# Patient Record
Sex: Male | Born: 1965 | Hispanic: Yes | Marital: Single | Smoking: Never smoker
Health system: Southern US, Community
[De-identification: ages and names within clinical notes are randomized; demographics above are authoritative.]

## PROBLEM LIST (undated history)

## (undated) HISTORY — PX: KNEE SURGERY: SHX244

---

## 2010-12-22 ENCOUNTER — Emergency Department (HOSPITAL_COMMUNITY): Payer: No Typology Code available for payment source

## 2010-12-22 ENCOUNTER — Emergency Department (HOSPITAL_COMMUNITY)
Admission: EM | Admit: 2010-12-22 | Discharge: 2010-12-23 | Disposition: A | Discharge status: Home / Self Care | Payer: No Typology Code available for payment source | Attending: Emergency Medicine | Admitting: Emergency Medicine

## 2010-12-22 DIAGNOSIS — S82109A Unspecified fracture of upper end of unspecified tibia, initial encounter for closed fracture: Secondary | ICD-10-CM | POA: Insufficient documentation

## 2010-12-22 DIAGNOSIS — S0003XA Contusion of scalp, initial encounter: Secondary | ICD-10-CM | POA: Insufficient documentation

## 2010-12-22 DIAGNOSIS — M25569 Pain in unspecified knee: Secondary | ICD-10-CM | POA: Insufficient documentation

## 2010-12-22 DIAGNOSIS — M542 Cervicalgia: Secondary | ICD-10-CM | POA: Insufficient documentation

## 2010-12-22 DIAGNOSIS — R51 Headache: Secondary | ICD-10-CM | POA: Insufficient documentation

## 2010-12-27 ENCOUNTER — Encounter (HOSPITAL_COMMUNITY)
Admission: RE | Admit: 2010-12-27 | Discharge: 2010-12-27 | Disposition: A | Discharge status: Home / Self Care | Payer: No Typology Code available for payment source | Source: Ambulatory Visit | Attending: Orthopedic Surgery | Admitting: Orthopedic Surgery

## 2010-12-27 DIAGNOSIS — Z01818 Encounter for other preprocedural examination: Secondary | ICD-10-CM | POA: Insufficient documentation

## 2010-12-27 DIAGNOSIS — Z01812 Encounter for preprocedural laboratory examination: Secondary | ICD-10-CM | POA: Insufficient documentation

## 2010-12-27 LAB — BASIC METABOLIC PANEL
BUN: 16 mg/dL (ref 6–23)
GFR calc non Af Amer: >60 mL/min (ref >60)
Glucose, Bld: 98 mg/dL (ref 70–99)
Potassium: 4.2 mEq/L (ref 3.5–5.1)

## 2010-12-27 LAB — CBC
MCH: 31.1 pg (ref 26.0–34.0)
MCHC: 35 g/dL (ref 30.0–36.0)
Platelets: 257 10*3/uL (ref 150–400)
RDW: 12.6 % (ref 11.5–15.5)

## 2010-12-27 LAB — PROTIME-INR: Prothrombin Time: 12.8 seconds (ref 11.6–15.2)

## 2010-12-28 ENCOUNTER — Ambulatory Visit (HOSPITAL_COMMUNITY): Payer: No Typology Code available for payment source

## 2010-12-28 ENCOUNTER — Ambulatory Visit (HOSPITAL_COMMUNITY)
Admission: RE | Admit: 2010-12-28 | Discharge: 2010-12-30 | Disposition: A | Discharge status: Home / Self Care | Payer: No Typology Code available for payment source | Source: Ambulatory Visit | Attending: Orthopedic Surgery | Admitting: Orthopedic Surgery

## 2010-12-28 DIAGNOSIS — X58XXXA Exposure to other specified factors, initial encounter: Secondary | ICD-10-CM | POA: Insufficient documentation

## 2010-12-28 DIAGNOSIS — S022XXA Fracture of nasal bones, initial encounter for closed fracture: Secondary | ICD-10-CM | POA: Insufficient documentation

## 2010-12-28 DIAGNOSIS — S82109A Unspecified fracture of upper end of unspecified tibia, initial encounter for closed fracture: Secondary | ICD-10-CM | POA: Insufficient documentation

## 2010-12-29 LAB — PROTIME-INR: INR: 1.07 (ref 0.00–1.49)
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
## 2012-08-23 IMAGING — CT CT KNEE*L* W/O CM
3 of 4 series · 15 of 33 positions shown, 19 images · non-contrast
Comparison: Plain films earlier today

CLINICAL DATA: knee fracture, pain

CT OF THE LEFT KNEE WITHOUT CONTRAST
TECHNIQUE: Multidetector CT imaging was performed according to the
standard protocol. Multiplanar CT image reconstructions were also
generated.

[Series 5: extremityknee 2.0 b40s · axial · 0.35mm/px · z∈[+403,+591]mm · 9 of 112 slices shown, 12 images]
[im 9/112  soft-tissue]
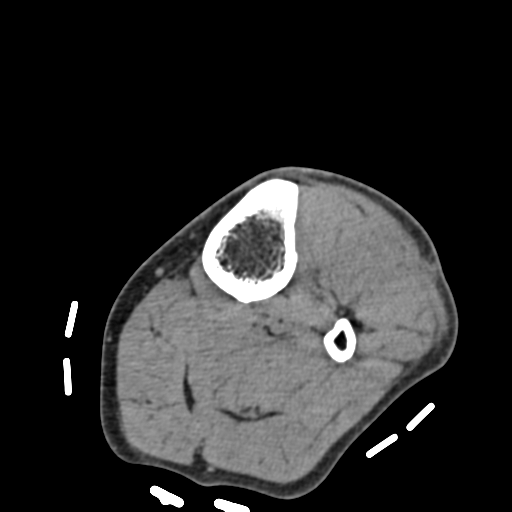
[im 9/112  bone]
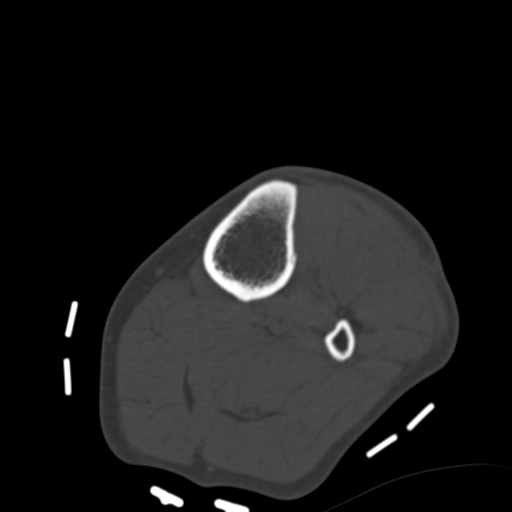
[im 26/112  bone]
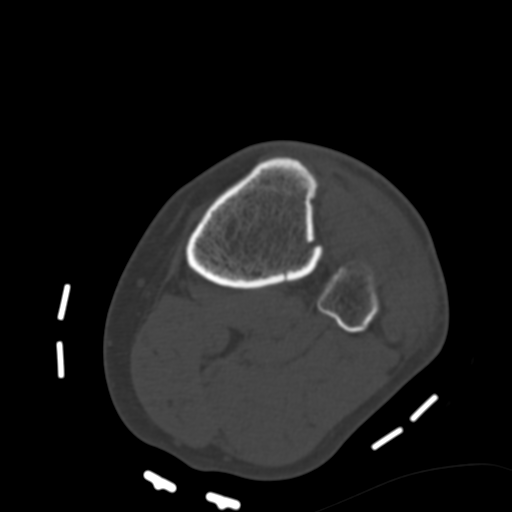
[im 35/112  bone]
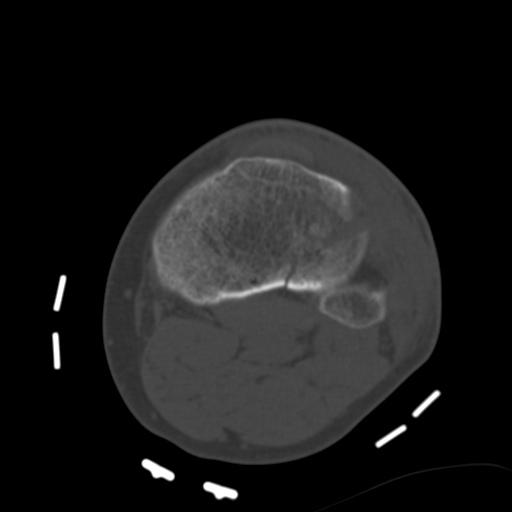
[im 43/112  bone]
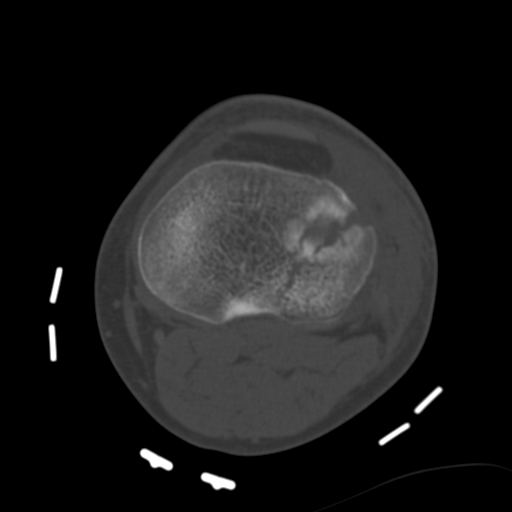
[im 60/112  soft-tissue]
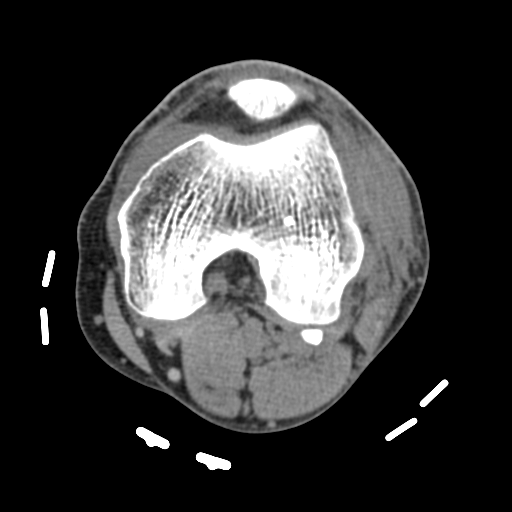
[im 60/112  bone]
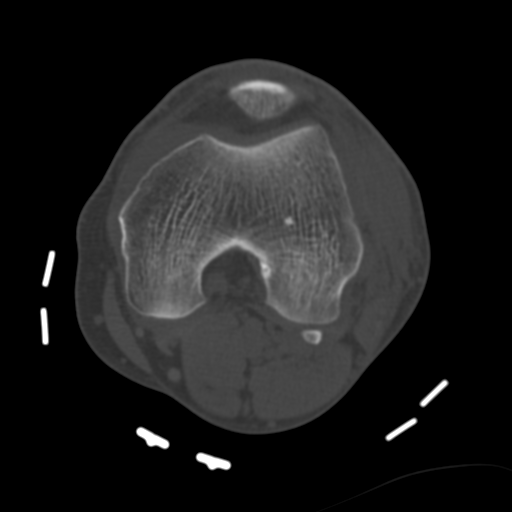
[im 69/112  bone]
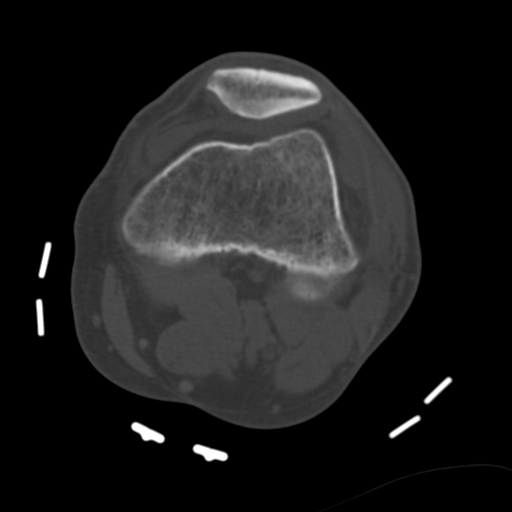
[im 77/112  bone]
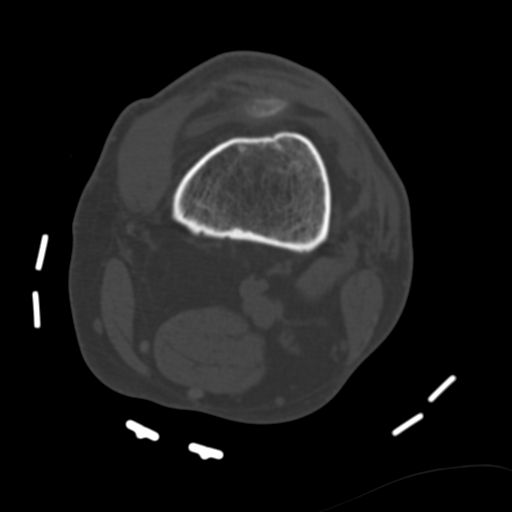
[im 94/112  bone]
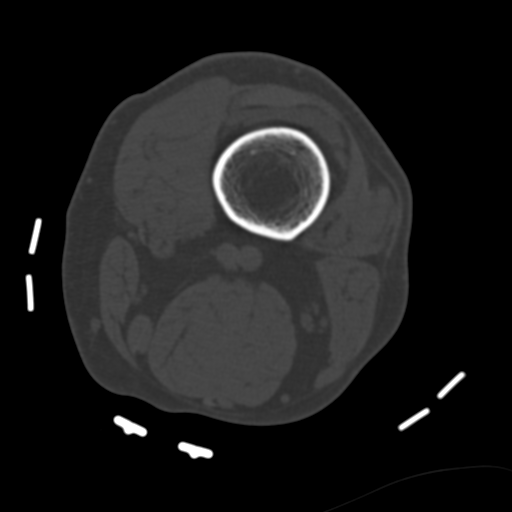
[im 103/112  soft-tissue]
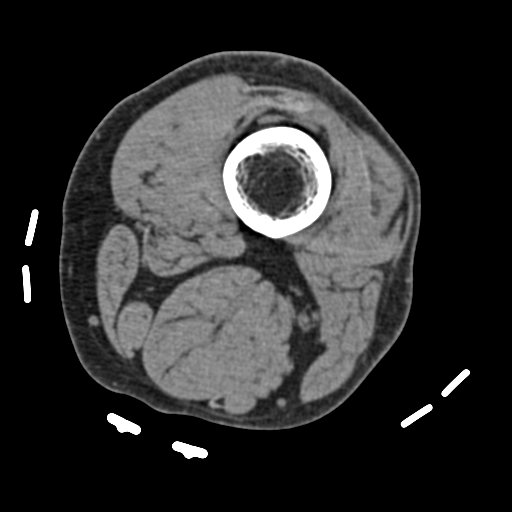
[im 103/112  bone]
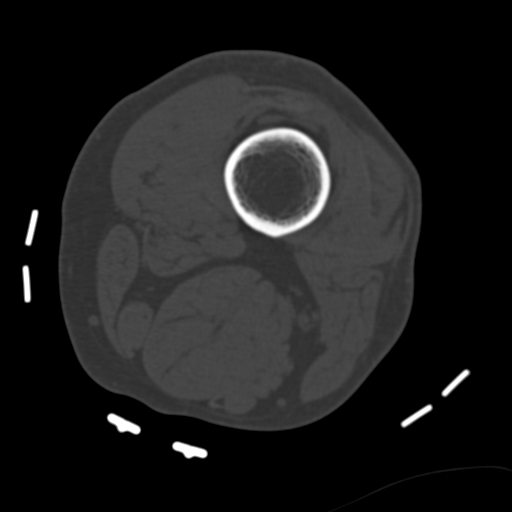

[Series 602: cor st · coronal · 0.44mm/px · 1 of 81 slices shown]
[im 41/81  bone]
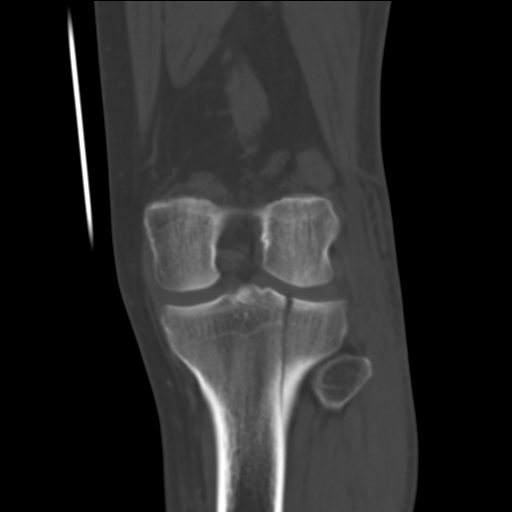

[Series 603: sag knee st left · sagittal · 0.44mm/px · 5 of 65 slices shown, 6 images]
[im 22/65  bone]
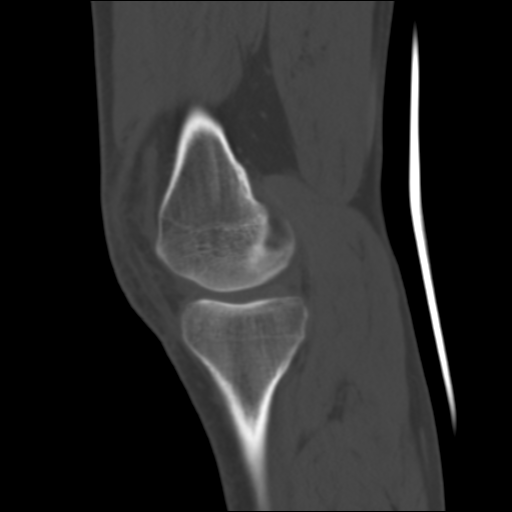
[im 27/65  bone]
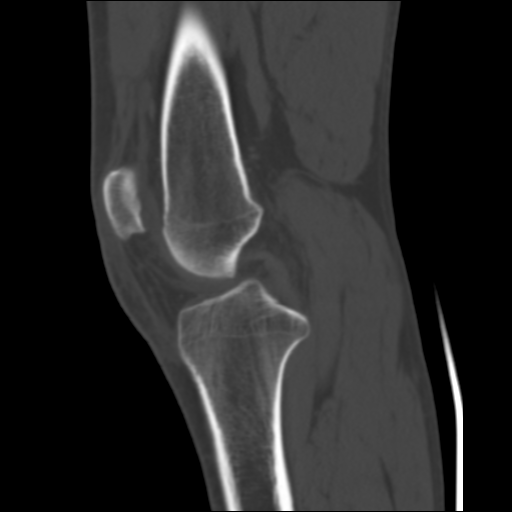
[im 33/65  soft-tissue]
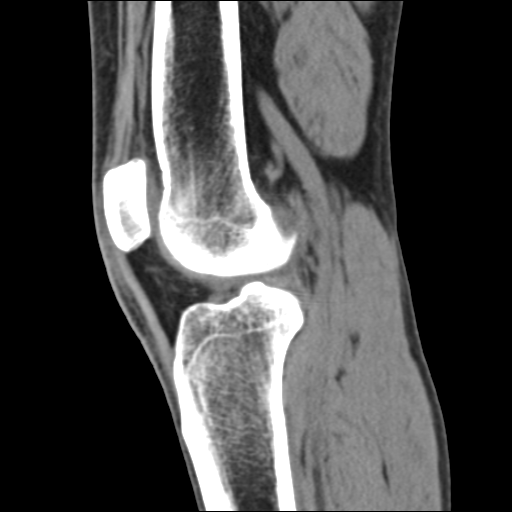
[im 33/65  bone]
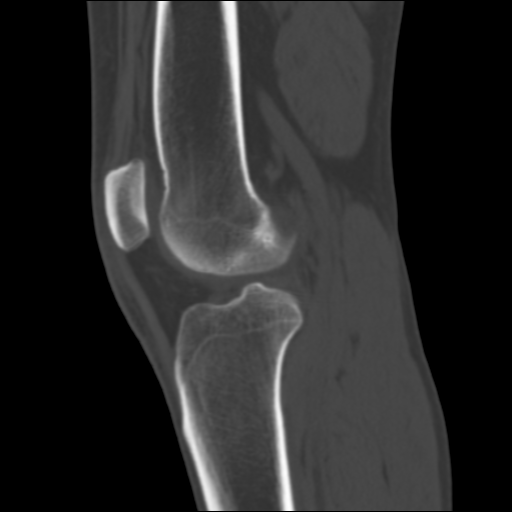
[im 38/65  bone]
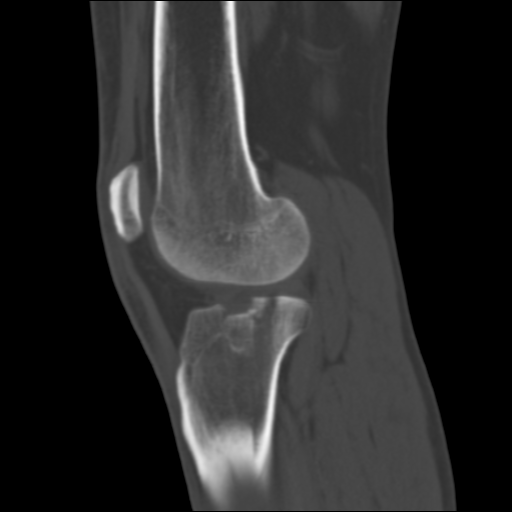
[im 43/65  bone]
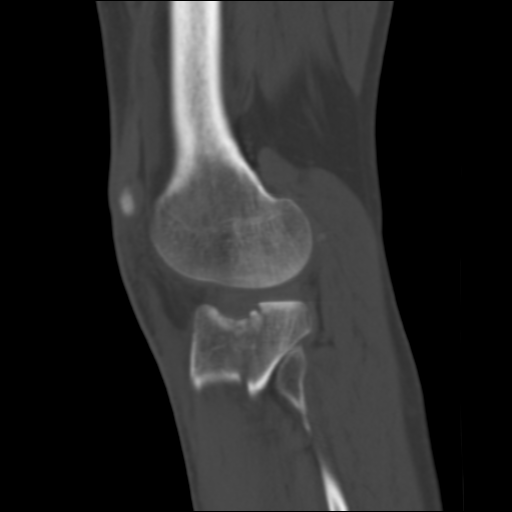

[15 of 33 positions shown; findings below may reference images not displayed]

FINDINGS: Lateral tibial plateau fracture is demonstrated with a
prominent sagittal component which exits inferiorly.  Centrally,
there is approximately 3-4 mm of depression of the lateral tibial
plateau.  This is identified best on coronal images 35 - 39.
Depression is also confirmed on sagittal reformatted images 38-45.
Medial tibial plateau is intact.  No femur injury.  Small joint
effusion.
IMPRESSION: Mildly depressed lateral tibial plateau fracture as described.
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
## 2018-07-08 ENCOUNTER — Encounter: Payer: Self-pay | Admitting: Physician Assistant

## 2018-07-08 ENCOUNTER — Encounter: Payer: Self-pay | Admitting: Gastroenterology

## 2018-07-08 ENCOUNTER — Other Ambulatory Visit: Payer: Self-pay

## 2018-07-08 ENCOUNTER — Ambulatory Visit: Payer: Self-pay | Admitting: Physician Assistant

## 2018-07-08 VITALS — BP 136/81 | HR 74 | Temp 98.1°F | Resp 18 | Ht 67.0 in | Wt 192.2 lb

## 2018-07-08 DIAGNOSIS — M25571 Pain in right ankle and joints of right foot: Secondary | ICD-10-CM

## 2018-07-08 DIAGNOSIS — S96911A Strain of unspecified muscle and tendon at ankle and foot level, right foot, initial encounter: Secondary | ICD-10-CM

## 2018-07-08 DIAGNOSIS — R1111 Vomiting without nausea: Secondary | ICD-10-CM

## 2018-07-08 DIAGNOSIS — K219 Gastro-esophageal reflux disease without esophagitis: Secondary | ICD-10-CM

## 2018-07-08 MED ORDER — MELOXICAM 7.5 MG PO TABS: 7.5000 mg | ORAL_TABLET | Freq: Every day | ORAL | 0 refills | Status: AC

## 2018-07-08 MED ORDER — PANTOPRAZOLE SODIUM 40 MG PO TBEC
DELAYED_RELEASE_TABLET | ORAL | 1 refills | Status: DC
Start: 2018-07-08 — End: 2018-07-23

## 2018-07-08 NOTE — Progress Notes (Signed)
Jesse Martinez  MRN: [id number] DOB: Jan 03, 1966  PCP: Patient, No Pcp Per  Subjective:  Pt is a 52 year old male who presents to clinic for several complaints.   1) Right ankle pain x 6 weeks.  He was walking at work when he first noticed the pain on the outside of his right ankle below the ankle bone.  He wears work boots with orthotic support.  He has never had this problem before.  He is not taking anything for pain.  He recently started using an Ace wrap but this is not helping.  2) vomiting x 2-3 years. "Anytime I drink something it comes back up" No problems with food coming back up. However he admits to food sometimes feeling stuck and occasional vomiting after dinner.  nothing makes it better. No hematemesis.  Denies abdominal pain, burning sensation of his chest, sour taste in his throat, nausea, fever, chills. He has never been seen for this problem before.    Review of Systems  Gastrointestinal: Positive for vomiting. Negative for abdominal pain, diarrhea and nausea.  Musculoskeletal: Positive for arthralgias (Right ankle) and gait problem. Negative for joint swelling.  Skin: Negative.   Neurological: Negative for weakness and numbness.    There are no active problems to display for this patient.   No current outpatient medications on file prior to visit.   No current facility-administered medications on file prior to visit.     No Known Allergies   Objective:  BP 136/81   Pulse 74   Temp 98.1 F (36.7 C) (Oral)   Resp 18   Ht 5\' 7"  (1.702 m)   Wt 192 lb 3.2 oz (87.2 kg)   SpO2 94%   BMI 30.10 kg/m   Physical Exam  Constitutional: He is oriented to person, place, and time. He appears well-developed and well-nourished.  Abdominal: Soft. Normal appearance. There is no tenderness.  Musculoskeletal:       Right ankle: He exhibits normal range of motion, no swelling and no ecchymosis. Tenderness. AITFL tenderness found.  Neurological: He is alert and  oriented to person, place, and time.  Skin: Skin is warm and dry.  Psychiatric: He has a normal mood and affect. His behavior is normal. Judgment and thought content normal.  Vitals reviewed.   Assessment and Plan :  1. Vomiting without nausea, intractability of vomiting not specified, unspecified vomiting type 2. Gastroesophageal reflux disease, esophagitis presence not specified -Patient presents with regurgitation of liquids for the past several years.  Will treat supportively with Protonix at this time, however I feel he would benefit from evaluation from GI with endoscopy.  He understands and agrees with plan. - Ambulatory referral to Gastroenterology - pantoprazole (PROTONIX) 40 MG tablet; Take one tablet 30 min before the first meal of the day for 4-6 weeks.  Dispense: 30 tablet; Refill: 1  3. Strain of right ankle, initial encounter 4. Pain in joint involving right ankle and foot -Patient presents with right ankle pain for the past 6 weeks.  Suspect a ATFL strain.  Ace wrap applied.  Discussed price principles.  Plan to refer to physical therapy as he is several weeks out from injury and pain is not improving. - Ambulatory referral to Physical Therapy - meloxicam (MOBIC) 7.5 MG tablet; Take 1-2 tablets (7.5-15 mg total) by mouth daily.  Dispense: 60 tablet; Refill: 0   Mercer Pod, PA-C  [facility] [place] 07/08/2018 2:19 PM  Please note:  Portions of this report may have been transcribed using dragon voice recognition software. Every effort was made to ensure accuracy; however, inadvertent computerized transcription errors may be present.

## 2018-07-08 NOTE — Patient Instructions (Addendum)
You will receive a phone call to schedule an appointment with gastroenterology -- you will have an evaluation of your esophagus and stomach to figure out why you are throwing up.  Start taking Pantoprazole 40mg  30 min before your first meal of the day x 4-6 weeks.    For your ankle:   You will receive a phone call to schedule an appointment with physical therapy for your ankle.   Take Meloxicam 7.5-15mg  daily. This is an NSAID. Do not use with any other otc pain medication other than tylenol/acetaminophen - so no aleve, ibuprofen, motrin, advil, etc.  Initial management goals are to limit pain and swelling and to maintain range of motion before gradually increasing exercise. RICE (rest, ice, compression, elevation) is commonly recommended for the first two to three days:  ?Rest is achieved by limiting weightbearing; use crutches until you are able to walk with a normal gait. ?Apply ice for 15 to 20 minutes every two to three hours for the first 48 hours or until swelling is improved, whichever comes first. ?Compression with an elastic bandage to minimize swelling should be applied early. ?The injured ankle should be kept elevated above the level of the heart to further alleviate swelling.  Nonsteroidal antiinflammatory drugs (NSAIDs) can be used for pain relief. So naproxen, meloxicam, ibuprofen, motrin or aleve.   Exercise is the mainstay of recovery. Range of motion exercises, including plantar flexion, dorsiflexion, and foot circles, should be started early, once acute pain and swelling subside, to maintain range of motion. The intensity of rehabilitation is increased gradually. Ankle splints or braces can limit extremes of joint motion and allow early weightbearing while protecting against reinjury.  Ankle injuries may take up to 8 weeks to heal.       IF you received an x-ray today, you will receive an invoice from [facility]. Please contact [facility] at  [phone] with questions or concerns regarding your invoice.   IF you received labwork today, you will receive an invoice from [facility]. Please contact [facility] at [phone] with questions or concerns regarding your invoice.   Our billing staff will not be able to assist you with questions regarding bills from these companies.  You will be contacted with the lab results as soon as they are available. The fastest way to get your results is to activate your My Chart account. Instructions are located on the last page of this paperwork. If you have not heard from [place] regarding the results in 2 weeks, please contact this office.

## 2018-07-23 ENCOUNTER — Ambulatory Visit (INDEPENDENT_AMBULATORY_CARE_PROVIDER_SITE_OTHER): Payer: Self-pay | Admitting: Gastroenterology

## 2018-07-23 ENCOUNTER — Encounter: Payer: Self-pay | Admitting: Gastroenterology

## 2018-07-23 VITALS — BP 116/88 | HR 69 | Ht 67.0 in | Wt 193.4 lb

## 2018-07-23 DIAGNOSIS — Z1211 Encounter for screening for malignant neoplasm of colon: Secondary | ICD-10-CM

## 2018-07-23 DIAGNOSIS — R1310 Dysphagia, unspecified: Secondary | ICD-10-CM

## 2018-07-23 DIAGNOSIS — Z1212 Encounter for screening for malignant neoplasm of rectum: Secondary | ICD-10-CM

## 2018-07-23 DIAGNOSIS — R1110 Vomiting, unspecified: Secondary | ICD-10-CM

## 2018-07-23 DIAGNOSIS — R1013 Epigastric pain: Secondary | ICD-10-CM

## 2018-07-23 MED ORDER — PANTOPRAZOLE SODIUM 40 MG PO TBEC: 40.0000 mg | DELAYED_RELEASE_TABLET | Freq: Every day | ORAL | 3 refills | Status: AC

## 2018-07-23 MED ORDER — SOD PICOSULFATE-MAG OX-CIT ACD 10-3.5-12 MG-GM -GM/160ML PO SOLN: 1.0000 | ORAL | 0 refills | Status: DC

## 2018-07-23 NOTE — Patient Instructions (Signed)
If you are age 51 or older, your body mass index should be between 23-30. Your Body mass index is 30.29 kg/m. If this is out of the aforementioned range listed, please consider follow up with your Primary Care Provider.  If you are age 7 or younger, your body mass index should be between 19-25. Your Body mass index is 30.29 kg/m. If this is out of the aformentioned range listed, please consider follow up with your Primary Care Provider.   You have been scheduled for an endoscopy and colonoscopy. Please follow the written instructions given to you at your visit today. Please pick up your prep supplies at the pharmacy within the next 1-3 days. If you use inhalers (even only as needed), please bring them with you on the day of your procedure. Your physician has requested that you go to www.startemmi.com and enter the access code given to you at your visit today. This web site gives a general overview about your procedure. However, you should still follow specific instructions given to you by our office regarding your preparation for the procedure.  We have sent the following medications to your pharmacy for you to pick up at your convenience: Protonix 40 mg by mouth once daily.  It was a pleasure to see you today!  Vito Cirigliano, D.O.

## 2018-07-23 NOTE — Progress Notes (Signed)
Chief Complaint: Regurgitation   Referring Provider:     Dorise Hiss    HPI:     Jesse Martinez is a 52 y.o. male referred to the Gastroenterology Clinic for evaluation of regurgitation.   Sxs worse in the morning, with regurgitation of recently ingested fluids. Has been ongoing for years, but worsening over recent months. No HB, waterbrash, chronic cough. No nocturnal sxs. Does not occur with solids, but has had separate episodes of dysphagia to solids. This occurs intermittently for a few years. No hx of food impactions. No overt GI blood loss.  He was evaluated for this by his PCM on 07/08/18 and prescribed course of Protonix 40 mg daily and referred to the GI clinic for further evaluation. He has not started the pantoprazole- wanted to wait for this appt first. Has not trialed any OTC meds.  He does endorse intermittent MEG pain, which is unrelated to timing of above regurgitation. No radiation and not related to PO intake. No exacerbating or alleviating factors. Patient otherwise denies diarrhea, constipation, hematochezia, melena, night sweats, fever, chills, weight loss, early satiety, dysphagia, odynophagia.  No prior EGD or colonoscopy. No prior CRC screening. FHx w/o GI malignancy, hepatobiliary disease, IBD.   History reviewed. No pertinent past medical history.   Past Surgical History:  Procedure Laterality Date  . KNEE REPAIR EXTENSOR MECHANISM Left    had a screw put in 7 years    Family History  Problem Relation Age of Onset  . Diabetes Father   . Breast cancer Sister   . Colon cancer Neg Hx   . Esophageal cancer Neg Hx    Social History   Tobacco Use  . Smoking status: Never Smoker  . Smokeless tobacco: Never Used  Substance Use Topics  . Alcohol use: Yes    Comment: 3 beers a day   . Drug use: Never   Current Outpatient Medications  Medication Sig Dispense Refill  . meloxicam (MOBIC) 7.5 MG tablet Take 1-2 tablets  (7.5-15 mg total) by mouth daily. (Patient not taking: Reported on 07/23/2018) 60 tablet 0  . pantoprazole (PROTONIX) 40 MG tablet Take one tablet 30 min before the first meal of the day for 4-6 weeks. (Patient not taking: Reported on 07/23/2018) 30 tablet 1   No current facility-administered medications for this visit.    No Known Allergies   Review of Systems: All systems reviewed and negative except where noted in HPI.     Physical Exam:    Wt Readings from Last 3 Encounters:  07/23/18 193 lb 6 oz (87.7 kg)  07/08/18 192 lb 3.2 oz (87.2 kg)    BP 116/88   Pulse 69   Ht 5' 7"  (1.702 m)   Wt 193 lb 6 oz (87.7 kg)   BMI 30.29 kg/m  Constitutional:  Pleasant, in no acute distress. Psychiatric: Normal mood and affect. Behavior is normal. EENT: Pupils normal.  Conjunctivae are normal. No scleral icterus. Neck supple. No cervical LAD. Cardiovascular: Normal rate, regular rhythm. No edema Pulmonary/chest: Effort normal and breath sounds normal. No wheezing, rales or rhonchi. Abdominal: Soft, nondistended, nontender. Bowel sounds active throughout. There are no masses palpable. No hepatomegaly. Neurological: Alert and oriented to person place and time. Skin: Skin is warm and dry. No rashes noted.   ASSESSMENT AND PLAN;   1) Regurgitation: Clinical presentation seems more c/w reflux given overall lack of nausea and not c/w  Rumination Syndrome. Will eval and tx as below: - Start pantoprazole 40 mg daily for diagnostic and therapeutic effect. Will titrate to lowest effective dose - EGD to eval for LES laxity, hiatal hernia, erosive esophagitis  2) Dysphagia: - Intermittent solid food dysphagia - EGD with possible dilation - Continue to cut food into small pieces, chew thoroughly, and eat with plenty of fluids  3) MEG pain: - Will eval for PUD, gastritis, etc along with plan for random and directed gastric bxs at time of EGD as above to r/o H pylori.   4) CRC screening: No  previous CRC screneing. No family history of CRC or related malignancies, and patient is without any active GI sxs. Discussed options for CRC screening, to include optical vs virtual colonoscopy - the risks and benefits and pros and cons of each, as well as discussion of FIT kit testing, Cologuard, etc, and the patient decided to proceed with an optical colonoscopy.   The indications, risks, and benefits of EGD and colonoscopy were explained to the patient in detail. Risks include but are not limited to bleeding, perforation, adverse reaction to medications, and cardiopulmonary compromise. Sequelae include but are not limited to the possibility of surgery, hositalization, and mortality. The patient verbalized understanding and wished to proceed. All questions answered, referred to scheduler and bowel prep ordered. Further recommendations pending results of the exam.     Lavena Bullion, DO, FACG  07/23/2018, 2:08 PM   [place], Letta Median*

## 2018-08-05 ENCOUNTER — Encounter: Payer: Self-pay | Admitting: Gastroenterology

## 2018-08-05 ENCOUNTER — Ambulatory Visit (AMBULATORY_SURGERY_CENTER): Payer: Self-pay | Admitting: Gastroenterology

## 2018-08-05 VITALS — BP 137/86 | HR 61 | Temp 97.3°F | Resp 15 | Ht 67.0 in | Wt 193.0 lb

## 2018-08-05 DIAGNOSIS — K219 Gastro-esophageal reflux disease without esophagitis: Secondary | ICD-10-CM

## 2018-08-05 DIAGNOSIS — D126 Benign neoplasm of colon, unspecified: Secondary | ICD-10-CM

## 2018-08-05 DIAGNOSIS — K2970 Gastritis, unspecified, without bleeding: Secondary | ICD-10-CM

## 2018-08-05 DIAGNOSIS — D124 Benign neoplasm of descending colon: Secondary | ICD-10-CM

## 2018-08-05 DIAGNOSIS — K635 Polyp of colon: Secondary | ICD-10-CM

## 2018-08-05 DIAGNOSIS — Z1211 Encounter for screening for malignant neoplasm of colon: Secondary | ICD-10-CM

## 2018-08-05 DIAGNOSIS — D125 Benign neoplasm of sigmoid colon: Secondary | ICD-10-CM

## 2018-08-05 MED ORDER — SODIUM CHLORIDE 0.9 % IV SOLN
500.0000 mL | Freq: Once | INTRAVENOUS | Status: DC
Start: 2018-08-05 — End: 2018-08-05

## 2018-08-05 NOTE — Progress Notes (Signed)
Report to PACU, RN, vss, BBS= Clear.  

## 2018-08-05 NOTE — Progress Notes (Signed)
Pt's states no medical or surgical changes since previsit or office visit. 

## 2018-08-05 NOTE — Op Note (Signed)
[place] Patient Name: Jesse Martinez Procedure Date: 08/05/2018 2:06 PM MRN: [id number] Endoscopist: Gerrit Heck , MD Age: 52 Referring MD:  Date of Birth: February 03, 1966 Gender: Male Account #: [id number] Procedure:                Colonoscopy Indications:              Screening for colorectal malignant neoplasm, This                            is the patient's first colonoscopy Medicines:                Monitored Anesthesia Care Procedure:                Pre-Anesthesia Assessment:                           - Prior to the procedure, a History and Physical                            was performed, and patient medications and                            allergies were reviewed. The patient's tolerance of                            previous anesthesia was also reviewed. The risks                            and benefits of the procedure and the sedation                            options and risks were discussed with the patient.                            All questions were answered, and informed consent                            was obtained. Prior Anticoagulants: The patient has                            taken no previous anticoagulant or antiplatelet                            agents. ASA Grade Assessment: II - A patient with                            mild systemic disease. After reviewing the risks                            and benefits, the patient was deemed in                            satisfactory condition to undergo the procedure.  After obtaining informed consent, the colonoscope                            was passed under direct vision. Throughout the                            procedure, the patient's blood pressure, pulse, and                            oxygen saturations were monitored continuously. The                            Colonoscope was introduced through the anus and                            advanced to the the  terminal ileum. The colonoscopy                            was performed without difficulty. The patient                            tolerated the procedure well. The quality of the                            bowel preparation was adequate. Scope In: 2:25:30 PM Scope Out: 2:39:19 PM Scope Withdrawal Time: 0 hours 11 minutes 21 seconds  Total Procedure Duration: 0 hours 13 minutes 49 seconds  Findings:                 The perianal and digital rectal examinations were                            normal.                           A 4 mm polyp was found in the descending colon. The                            polyp was sessile. The polyp was removed with a                            cold snare. Resection and retrieval were complete.                            Estimated blood loss was minimal.                           A 4 mm polyp was found in the sigmoid colon. The                            polyp was sessile. The polyp was removed with a                            cold snare. Resection and retrieval  were complete.                            Estimated blood loss was minimal.                           The retroflexed view of the distal rectum and anal                            verge was normal and showed no anal or rectal                            abnormalities.                           The terminal ileum appeared normal. Complications:            No immediate complications. Estimated Blood Loss:     Estimated blood loss was minimal. Impression:               - One 4 mm polyp in the descending colon, removed                            with a cold snare. Resected and retrieved.                           - One 4 mm polyp in the sigmoid colon, removed with                            a cold snare. Resected and retrieved.                           - The distal rectum and anal verge are normal on                            retroflexion view.                           - The examined portion of the  ileum was normal. Recommendation:           - Patient has a contact number available for                            emergencies. The signs and symptoms of potential                            delayed complications were discussed with the                            patient. Return to normal activities tomorrow.                            Written discharge instructions were provided to the  patient.                           - Resume previous diet today.                           - Continue present medications.                           - Await pathology results.                           - Repeat colonoscopy in 3 - 5 years for                            surveillance based on pathology results.                           - Return to GI office PRN. Gerrit Heck, MD 08/05/2018 2:46:28 PM

## 2018-08-05 NOTE — Progress Notes (Signed)
Interpreter used today at the [facility] for this pt.  Interpreter's name is- [employer], CAP.

## 2018-08-05 NOTE — Progress Notes (Signed)
Called to room to assist during endoscopic procedure.  Patient ID and intended procedure confirmed with present staff. Received instructions for my participation in the procedure from the performing physician.  

## 2018-08-05 NOTE — Op Note (Signed)
[place] Patient Name: Jesse Martinez Procedure Date: 08/05/2018 2:06 PM MRN: [id number] Endoscopist: Gerrit Heck , MD Age: 52 Referring MD:  Date of Birth: 1966/02/03 Gender: Male Account #: [id number] Procedure:                Upper GI endoscopy Indications:              Heartburn, Suspected esophageal reflux,                            Regurgitation Medicines:                Monitored Anesthesia Care Procedure:                Pre-Anesthesia Assessment:                           - Prior to the procedure, a History and Physical                            was performed, and patient medications and                            allergies were reviewed. The patient's tolerance of                            previous anesthesia was also reviewed. The risks                            and benefits of the procedure and the sedation                            options and risks were discussed with the patient.                            All questions were answered, and informed consent                            was obtained. Prior Anticoagulants: The patient has                            taken no previous anticoagulant or antiplatelet                            agents. ASA Grade Assessment: II - A patient with                            mild systemic disease. After reviewing the risks                            and benefits, the patient was deemed in                            satisfactory condition to undergo the procedure.  After obtaining informed consent, the endoscope was                            passed under direct vision. Throughout the                            procedure, the patient's blood pressure, pulse, and                            oxygen saturations were monitored continuously. The                            Model GIF-HQ190 [id number]) scope was introduced                            through the mouth, and advanced to the second part                             of duodenum. The upper GI endoscopy was                            accomplished without difficulty. The patient                            tolerated the procedure well. Scope In: Scope Out: Findings:                 The examined esophagus was normal.                           Esophagogastric landmarks were identified: the                            Z-line was found at 42 cm, the gastroesophageal                            junction was found at 42 cm and the site of hiatal                            narrowing was found at 42 cm from the incisors.                           The gastroesophageal flap valve was visualized                            endoscopically and classified as Hill Grade I                            (prominent fold, tight to endoscope).                           Diffuse mild inflammation characterized by erythema                            was found in  the gastric body and in the gastric                            antrum. Biopsies were taken with a cold forceps for                            Helicobacter pylori testing. Estimated blood loss                            was minimal.                           The duodenal bulb, first portion of the duodenum                            and second portion of the duodenum were normal. Complications:            No immediate complications. Estimated Blood Loss:     Estimated blood loss was minimal. Impression:               - Normal esophagus.                           - Esophagogastric landmarks identified.                           - Gastroesophageal flap valve classified as Hill                            Grade I (prominent fold, tight to endoscope).                           - Gastritis. Biopsied.                           - Normal duodenal bulb, first portion of the                            duodenum and second portion of the duodenum. Recommendation:           - Patient has a contact number  available for                            emergencies. The signs and symptoms of potential                            delayed complications were discussed with the                            patient. Return to normal activities tomorrow.                            Written discharge instructions were provided to the                            patient.                           -  Resume previous diet today.                           - Continue present medications.                           - Await pathology results.                           - Return to GI clinic PRN.                           - Perform ambulatory pH and impedance monitoring if                            symptoms persist. Gerrit Heck, MD 08/05/2018 2:43:55 PM

## 2018-08-05 NOTE — Patient Instructions (Signed)
USTED TUVO UN PROCEDIMIENTO ENDOSCPICO HOY EN EL [facility]:   Lea el informe del procedimiento que se le entreg para cualquier pregunta especfica sobre lo que se encontr durante su examen.  Si el informe del examen no responde a sus preguntas, por favor llame a su gastroenterlogo para aclararlo.  Si usted solicit que no se le den detalles de lo que se encontr en su procedimiento al acompaante que le va a cuidar, entonces el informe del procedimiento se ha incluido en un sobre sellado para que usted lo revise despus cuando le sea ms conveniente.   LO QUE PUEDE ESPERAR: Algunas sensaciones de hinchazn en el abdomen.  Puede tener ms gases de lo normal.  El caminar puede ayudarle a eliminar el aire que se le puso en el tracto gastrointestinal durante el procedimiento y reducir la hinchazn.  Si le hicieron una endoscopia inferior (como una colonoscopia o una sigmoidoscopia flexible), podra notar manchas de sangre en las heces fecales o en el papel higinico.  Si se someti a una preparacin intestinal para su procedimiento, es posible que no tenga una evacuacin intestinal normal durante algunos das.   Tenga en cuenta:  Es posible que note un poco de irritacin y congestin en la nariz o algn drenaje.  Esto es debido al oxgeno utilizado durante su procedimiento.  No hay que preocuparse y esto debe desaparecer ms o menos en un da.   SNTOMAS PARA REPORTAR INMEDIATAMENTE:  Despus de una endoscopia inferior (colonoscopia o sigmoidoscopia flexible):  Cantidades excesivas de sangre en las heces fecales  Sensibilidad significativa o empeoramiento de los dolores abdominales   Hinchazn aguda del abdomen que antes no tena   Fiebre de 100F o ms   Despus de la endoscopia superior (EGD)  Vmitos de sangre o material como caf molido   Dolor en el pecho o dolor debajo de los omplatos que antes no tena   Dolor o dificultad persistente para tragar  Falta de aire que antes no  tena   Fiebre de 100F o ms  Heces fecales negras y pegajosas   Para asuntos urgentes o de emergencia, puede comunicarse con un gastroenterlogo a cualquier hora llamando al [phone].  DIETA:  Recomendamos una comida pequea al principio, pero luego puede continuar con su dieta normal.  Tome muchos lquidos, pero debe evitar las bebidas alcohlicas durante 24 horas.    ACTIVIDAD:  Debe planear tomarse las cosas con calma por el resto del da y no debe CONDUCIR ni usar maquinaria pesada hasta maana (debido a los medicamentos de sedacin utilizados durante el examen).     SEGUIMIENTO: Nuestro personal llamar al nmero que aparece en su historial al siguiente da hbil de su procedimiento para ver cmo se siente y para responder cualquier pregunta o inquietud que pueda tener con respecto a la informacin que se le dio despus del procedimiento. Si no podemos contactarle, le dejaremos un mensaje.  Sin embargo, si se siente bien y no tiene ningn problema, no es necesario que nos devuelva la llamada.  Asumiremos que ha regresado a sus actividades diarias normales sin incidentes. Si se le tomaron algunas biopsias, le contactaremos por telfono o por carta en las prximas 3 semanas.  Si no ha sabido nada sobre las biopsias en el transcurso de 3 semanas, por favor llmenos al [phone].   FIRMAS/CONFIDENCIALIDAD: Usted y/o el acompaante que le cuide han firmado documentos que se ingresarn en su historial mdico electrnico.  Estas firmas atestiguan el hecho   de que la informacin anterior  

## 2018-08-06 ENCOUNTER — Telehealth: Payer: Self-pay | Admitting: *Deleted

## 2018-08-06 NOTE — Telephone Encounter (Signed)
  Follow up Call-  Call back number 08/05/2018  Post procedure Call Back phone  # [phone]  Permission to leave phone message Yes  Some recent data might be hidden     Patient questions:  Do you have a fever, pain , or abdominal swelling? No. Pain Score  0 *  Have you tolerated food without any problems? Yes.    Have you been able to return to your normal activities? Yes.    Do you have any questions about your discharge instructions: Diet   No. Medications  No. Follow up visit  No.  Do you have questions or concerns about your Care? No.  Actions: * If pain score is 4 or above: No action needed, pain <4.

## 2018-08-12 ENCOUNTER — Other Ambulatory Visit: Payer: Self-pay

## 2018-08-12 DIAGNOSIS — A048 Other specified bacterial intestinal infections: Secondary | ICD-10-CM

## 2018-08-12 DIAGNOSIS — K2970 Gastritis, unspecified, without bleeding: Secondary | ICD-10-CM

## 2018-08-12 MED ORDER — BISMUTH SUBSALICYLATE 262 MG PO CHEW: 524.0000 mg | CHEWABLE_TABLET | Freq: Four times a day (QID) | ORAL | 0 refills | Status: AC

## 2018-08-12 MED ORDER — DOXYCYCLINE HYCLATE 100 MG PO CAPS: 100.0000 mg | ORAL_CAPSULE | Freq: Two times a day (BID) | ORAL | 0 refills | Status: AC

## 2018-08-12 MED ORDER — OMEPRAZOLE 20 MG PO CPDR: 20.0000 mg | DELAYED_RELEASE_CAPSULE | Freq: Two times a day (BID) | ORAL | 0 refills | Status: AC

## 2018-08-12 MED ORDER — METRONIDAZOLE 250 MG PO TABS: 250.0000 mg | ORAL_TABLET | Freq: Four times a day (QID) | ORAL | 0 refills | Status: AC
